# Patient Record
Sex: Male | Born: 1965 | Race: White | Hispanic: No | Marital: Married
Health system: Southern US, Community
[De-identification: ages and names within clinical notes are randomized; demographics above are authoritative.]

---

## 2014-01-02 ENCOUNTER — Other Ambulatory Visit: Payer: Self-pay | Admitting: Orthopedic Surgery

## 2014-01-02 DIAGNOSIS — M545 Low back pain: Secondary | ICD-10-CM

## 2014-01-02 DIAGNOSIS — M533 Sacrococcygeal disorders, not elsewhere classified: Secondary | ICD-10-CM

## 2014-01-08 ENCOUNTER — Ambulatory Visit
Admission: RE | Admit: 2014-01-08 | Discharge: 2014-01-08 | Disposition: A | Discharge status: Home / Self Care | Payer: Worker's Compensation | Source: Ambulatory Visit | Attending: Orthopedic Surgery | Admitting: Orthopedic Surgery

## 2014-01-08 DIAGNOSIS — M533 Sacrococcygeal disorders, not elsewhere classified: Secondary | ICD-10-CM

## 2014-01-08 DIAGNOSIS — M545 Low back pain: Secondary | ICD-10-CM

## 2014-01-08 MED ORDER — DIAZEPAM 5 MG PO TABS
10.0000 mg | ORAL_TABLET | Freq: Once | ORAL | Status: AC
  Administered 2014-01-08: 10 mg via ORAL

## 2016-01-08 IMAGING — CT CT BIOPSY
1 series · 15 of 24 positions shown, 20 images · non-contrast
Comparison: none

CLINICAL DATA: LEFT sacroiliitis.  LEFT sacroiliac joint pain.

[Series 2: routine pelvis · axial · 0.70mm/px · z∈[-140,-108]mm · 15 of 24 slices shown, 20 images]
[im 2/24  soft-tissue]
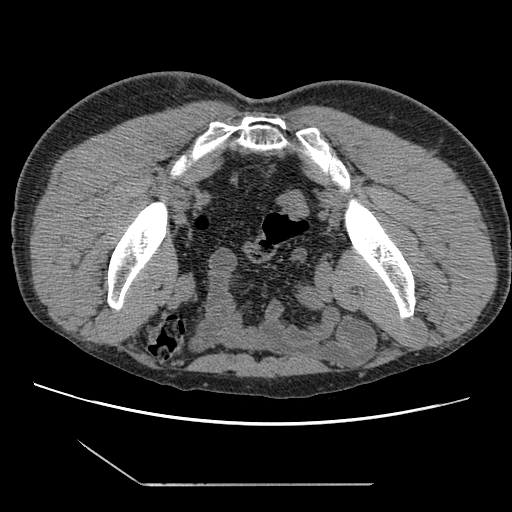
[im 2/24  bone]
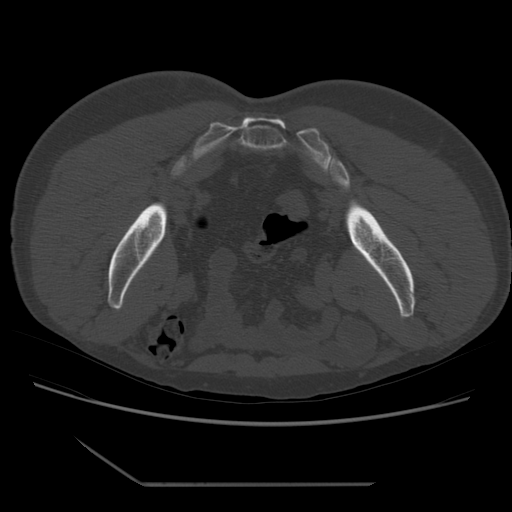
[im 4/24  soft-tissue]
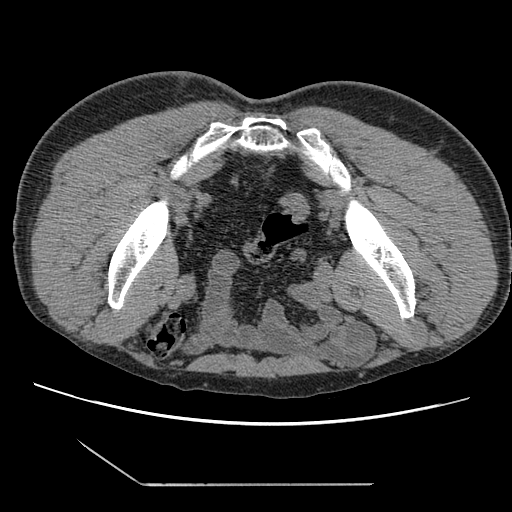
[im 5/24  soft-tissue]
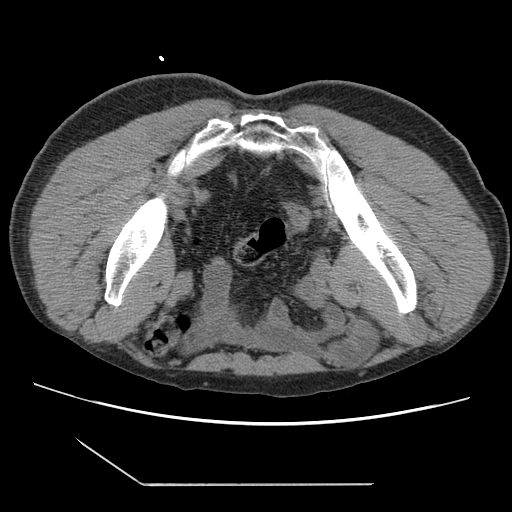
[im 7/24  soft-tissue]
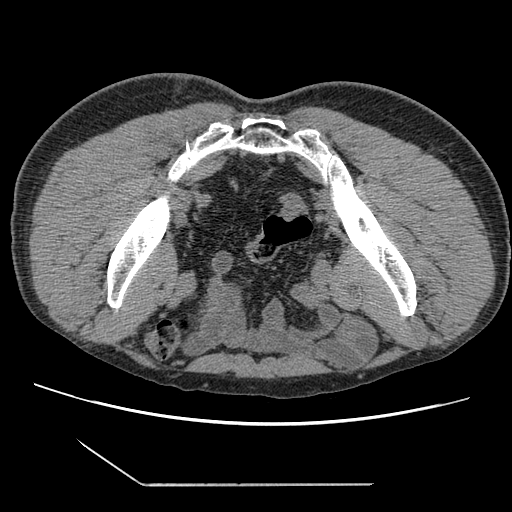
[im 9/24  soft-tissue]
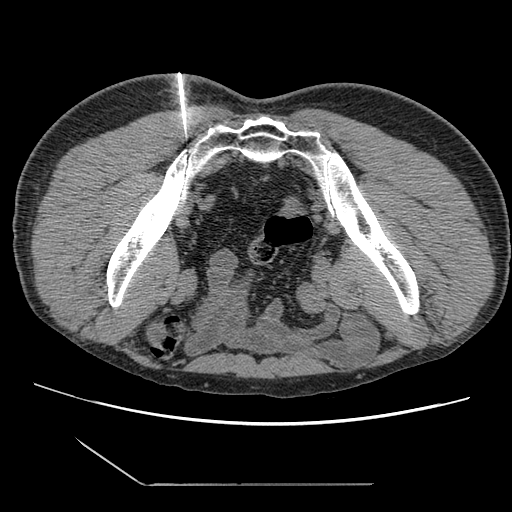
[im 10/24  soft-tissue]
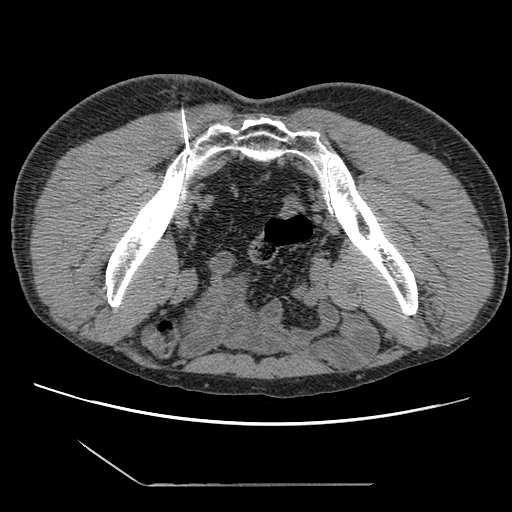
[im 12/24  soft-tissue]
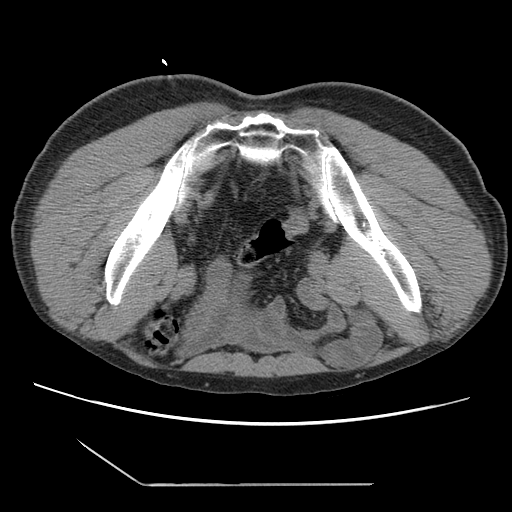
[im 13/24  soft-tissue]
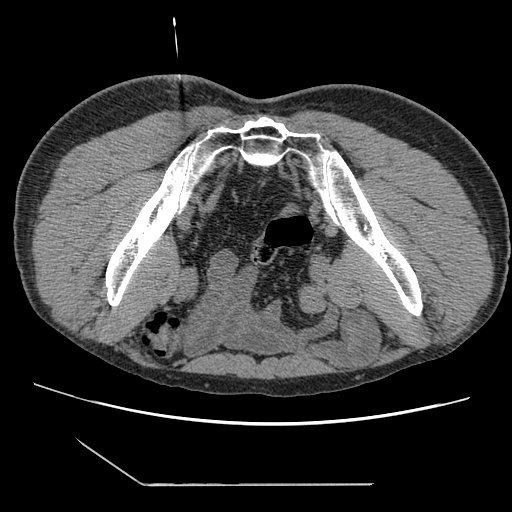
[im 15/24  soft-tissue]
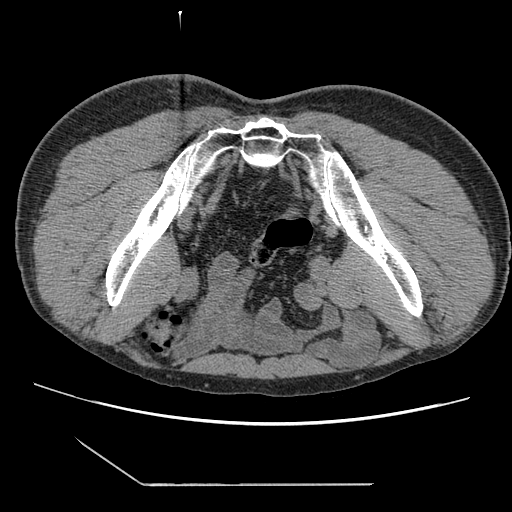
[im 15/24  bone]
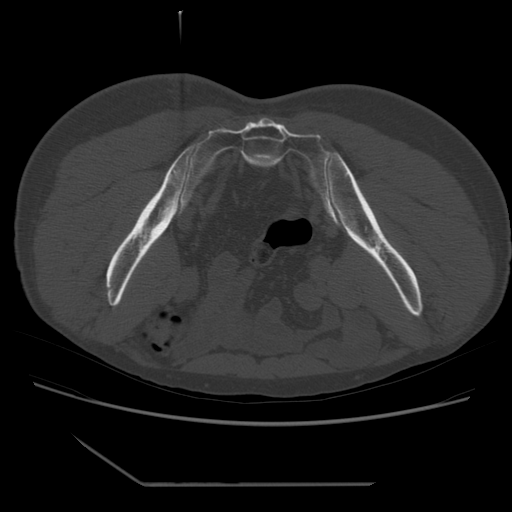
[im 16/24  soft-tissue]
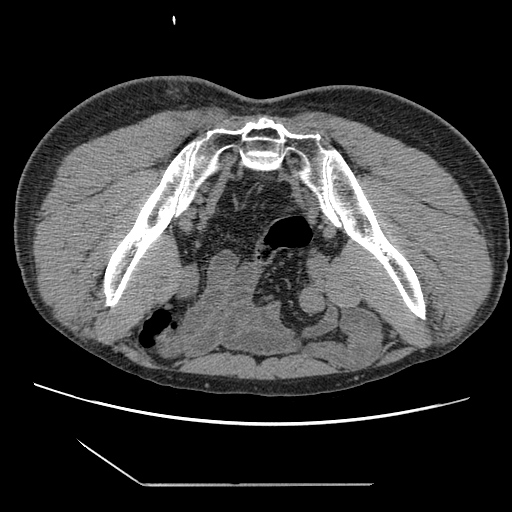
[im 18/24  soft-tissue]
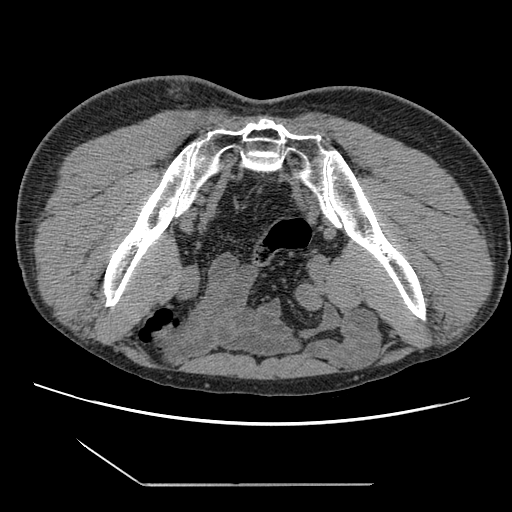
[im 20/24  soft-tissue]
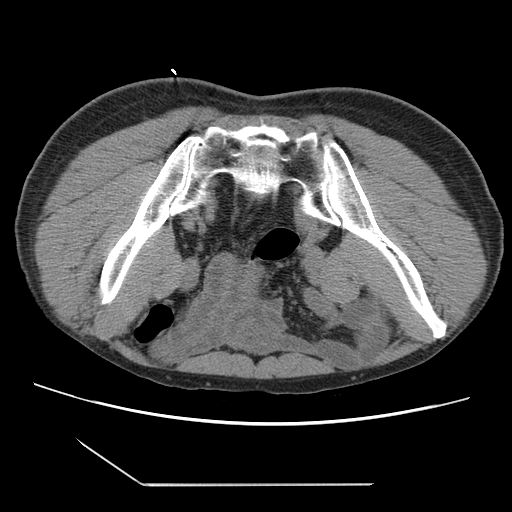
[im 20/24  lung]
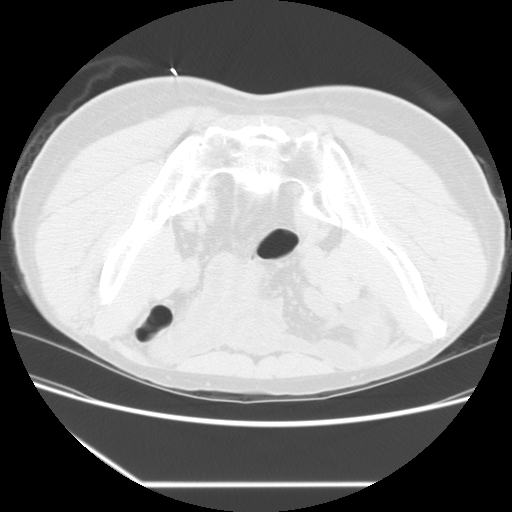
[im 21/24  soft-tissue]
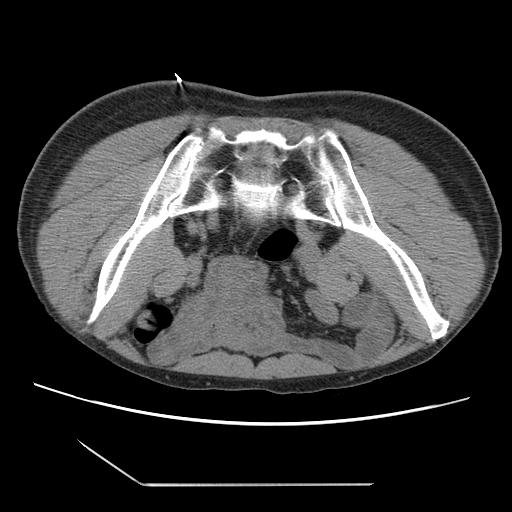
[im 21/24  lung]
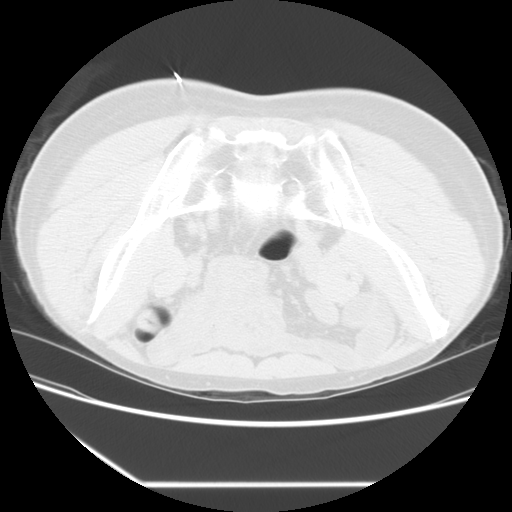
[im 22/24  lung]
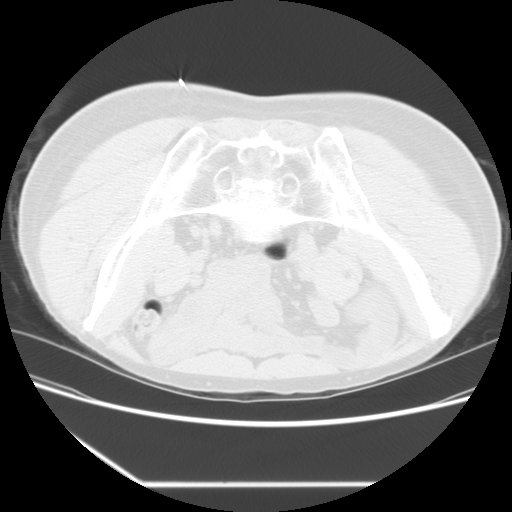
[im 23/24  soft-tissue]
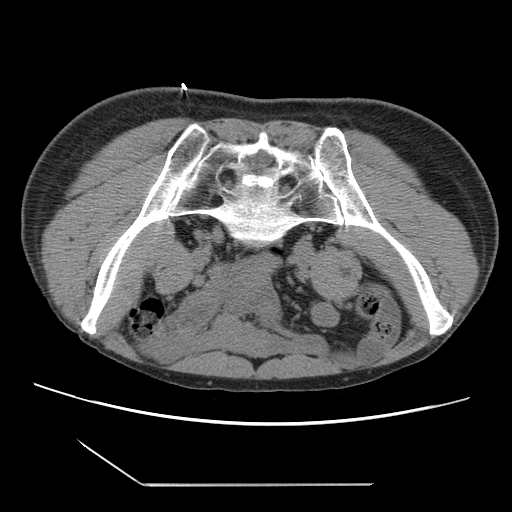
[im 23/24  lung]
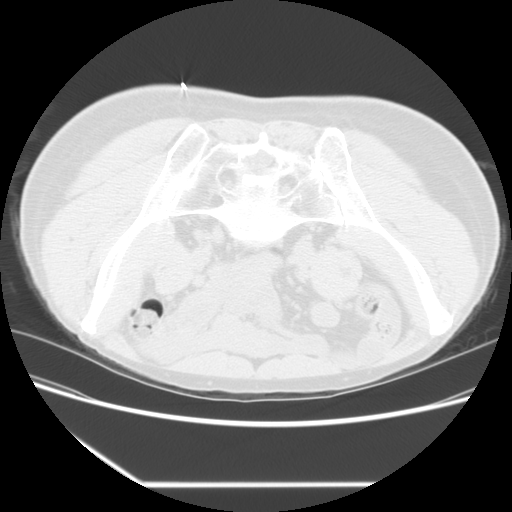

[15 of 24 positions shown; findings below may reference images not displayed]

:
PROCEDURE:

LEFT CT-guided SI JOINT INJECTION.

After a thorough discussion of risks and benefits of the procedure,
including bleeding, infection, injury to nerves, blood vessels, and
adjacent structures, verbal and written consent was obtained.
Specific risks of the procedure included
nondiagnostic/nontherapeutic injection and non target injection. The
patient was placed prone on the table and localization was performed
over the sacrum. Target site marked using CT guidance. The skin was
prepped and draped in the usual sterile fashion using Betadine soap.

After local anesthesia with 1% lidocaine without epinephrine and
subsequent deep anesthesia, a 3 and 0.5 inch 22 gauge spinal needle
was advanced into the LEFT SI joint. Subsequently, 120 mg of
Depo-Medrol 1 mL 0.5% Sensorcaine was injected into SI joint.
Needles removed and a sterile dressing applied. No complications.
Patient was discharged.
IMPRESSION: Successful CT guided LEFT steroid and anesthetic SI joint injection.
